# Patient Record
Sex: Male | Born: 1990 | Hispanic: No | Marital: Single | Smoking: Current every day smoker
Health system: Southern US, Community
[De-identification: ages and names within clinical notes are randomized; demographics above are authoritative.]

## PROBLEM LIST (undated history)

## (undated) DIAGNOSIS — I219 Acute myocardial infarction, unspecified: Secondary | ICD-10-CM

## (undated) DIAGNOSIS — D126 Benign neoplasm of colon, unspecified: Secondary | ICD-10-CM

## (undated) DIAGNOSIS — K589 Irritable bowel syndrome without diarrhea: Secondary | ICD-10-CM

## (undated) DIAGNOSIS — S0292XA Unspecified fracture of facial bones, initial encounter for closed fracture: Secondary | ICD-10-CM

## (undated) DIAGNOSIS — F419 Anxiety disorder, unspecified: Secondary | ICD-10-CM

## (undated) DIAGNOSIS — F99 Mental disorder, not otherwise specified: Secondary | ICD-10-CM

## (undated) HISTORY — DX: Benign neoplasm of colon, unspecified: D12.6

## (undated) HISTORY — DX: Irritable bowel syndrome without diarrhea: K58.9

## (undated) HISTORY — DX: Acute myocardial infarction, unspecified: I21.9

## (undated) HISTORY — DX: Unspecified fracture of facial bones, initial encounter for closed fracture: S02.92XA

---
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
## 2012-04-23 ENCOUNTER — Encounter (HOSPITAL_COMMUNITY): Payer: Self-pay | Admitting: *Deleted

## 2012-04-23 ENCOUNTER — Ambulatory Visit (HOSPITAL_COMMUNITY)
Admission: RE | Admit: 2012-04-23 | Discharge: 2012-04-23 | Disposition: A | Discharge status: Home / Self Care | Payer: 59 | Attending: Psychiatry | Admitting: Psychiatry

## 2012-04-23 HISTORY — DX: Anxiety disorder, unspecified: F41.9

## 2012-04-23 HISTORY — DX: Mental disorder, not otherwise specified: F99
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
## 2012-11-06 ENCOUNTER — Ambulatory Visit (INDEPENDENT_AMBULATORY_CARE_PROVIDER_SITE_OTHER): Payer: Managed Care, Other (non HMO) | Admitting: Physician Assistant

## 2012-11-06 VITALS — BP 102/72 | HR 73 | Temp 99.1°F | Resp 16 | Ht 70.5 in | Wt 164.4 lb

## 2012-11-06 DIAGNOSIS — F17200 Nicotine dependence, unspecified, uncomplicated: Secondary | ICD-10-CM | POA: Insufficient documentation

## 2012-11-06 DIAGNOSIS — Z23 Encounter for immunization: Secondary | ICD-10-CM

## 2012-11-06 DIAGNOSIS — K589 Irritable bowel syndrome without diarrhea: Secondary | ICD-10-CM | POA: Insufficient documentation

## 2012-11-06 DIAGNOSIS — Z113 Encounter for screening for infections with a predominantly sexual mode of transmission: Secondary | ICD-10-CM

## 2012-11-06 DIAGNOSIS — K645 Perianal venous thrombosis: Secondary | ICD-10-CM

## 2012-11-06 NOTE — Patient Instructions (Addendum)
Hemorrhoids Hemorrhoids are swollen veins around the rectum or anus. There are two types of hemorrhoids:   Internal hemorrhoids. These occur in the veins just inside the rectum. They may poke through to the outside and become irritated and painful.  External hemorrhoids. These occur in the veins outside the anus and can be felt as a painful swelling or hard lump near the anus. CAUSES  Pregnancy.   Obesity.   Constipation or diarrhea.   Straining to have a bowel movement.   Sitting for long periods on the toilet.  Heavy lifting or other activity that caused you to strain.  Anal intercourse. SYMPTOMS   Pain.   Anal itching or irritation.   Rectal bleeding.   Fecal leakage.   Anal swelling.   One or more lumps around the anus.  DIAGNOSIS  Your caregiver may be able to diagnose hemorrhoids by visual examination. Other examinations or tests that may be performed include:   Examination of the rectal area with a gloved hand (digital rectal exam).   Examination of anal canal using a small tube (scope).   A blood test if you have lost a significant amount of blood.  A test to look inside the colon (sigmoidoscopy or colonoscopy). TREATMENT Most hemorrhoids can be treated at home. However, if symptoms do not seem to be getting better or if you have a lot of rectal bleeding, your caregiver may perform a procedure to help make the hemorrhoids get smaller or remove them completely. Possible treatments include:   Placing a rubber band at the base of the hemorrhoid to cut off the circulation (rubber band ligation).   Injecting a chemical to shrink the hemorrhoid (sclerotherapy).   Using a tool to burn the hemorrhoid (infrared light therapy).   Surgically removing the hemorrhoid (hemorrhoidectomy).   Stapling the hemorrhoid to block blood flow to the tissue (hemorrhoid stapling).  HOME CARE INSTRUCTIONS   Eat foods with fiber, such as whole grains, beans,  nuts, fruits, and vegetables. Ask your doctor about taking products with added fiber in them (fibersupplements).  Increase fluid intake. Drink enough water and fluids to keep your urine clear or pale yellow.   Exercise regularly.   Go to the bathroom when you have the urge to have a bowel movement. Do not wait.   Avoid straining to have bowel movements.   Keep the anal area dry and clean. Use wet toilet paper or moist towelettes after a bowel movement.   Medicated creams and suppositories may be used or applied as directed.   Only take over-the-counter or prescription medicines as directed by your caregiver.   Take warm sitz baths for 15 20 minutes, 3 4 times a day to ease pain and discomfort.   Place ice packs on the hemorrhoids if they are tender and swollen. Using ice packs between sitz baths may be helpful.   Put ice in a plastic bag.   Place a towel between your skin and the bag.   Leave the ice on for 15 20 minutes, 3 4 times a day.   Do not use a donut-shaped pillow or sit on the toilet for long periods. This increases blood pooling and pain.  SEEK MEDICAL CARE IF:  You have increasing pain and swelling that is not controlled by treatment or medicine.  You have uncontrolled bleeding.  You have difficulty or you are unable to have a bowel movement.  You have pain or inflammation outside the area of the hemorrhoids. MAKE SURE YOU:  Understand these instructions.  Will watch your condition.  Will get help right away if you are not doing well or get worse. Document Released: 01/26/2000 Document Revised: 01/15/2012 Document Reviewed: 12/03/2011 [facility] Patient Information 2014 [place], [place].  HOME CARE INSTRUCTIONS after incision and drainage  Avoid straining when having bowel movements.  Avoid heavy lifting (more than 10 pounds (4.5 kilograms)).  Only take over-the-counter or prescription medicines for pain, discomfort, or fever as directed by  your caregiver.  Take hot sitz baths for 20 to 30 minutes, 3 to 4 times per day.  To keep swelling down, apply an ice pack for twenty minutes three to four times per day between sitz baths. Use a towel between your skin and the ice pack. Do not do this if it causes too much discomfort.  Keep anal area clean and dry. Following a bowel movement, you can gently wash the area with tucks (available for purchase at a drugstore) or cotton swabs. Gently pat the area dry. Do not rub the area.  Eat a well balanced diet and drink 6 to 8 glasses of water every day to avoid constipation. A bulk laxative may be also be helpful. SEEK MEDICAL CARE IF:   You have increasing pain or tenderness near or in the surgical site.  You are unable to eat or drink.  You develop nausea or vomiting.  You develop uncontrolled bleeding such as soaking two to three pads in one hour.  You have constipation, not helped by changing your diet or increasing your fluid intake. Pain medications are a common cause of constipation.  You have pain and redness (inflammation) extending outside the area of your surgery.  You develop an unexplained oral temperature above 102 F (38.9 C), or any other signs of infection.  You have any other questions or concerns following surgery. Document Released: 04/20/2003 Document Revised: 04/22/2011 Document Reviewed: 07/18/2008 [facility] Patient Information 2014 [place], [place].
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
## 2012-11-07 LAB — HEPATITIS B SURFACE ANTIGEN: Hepatitis B Surface Ag: NEGATIVE

## 2012-11-07 LAB — RPR

## 2012-11-07 LAB — HIV ANTIBODY (ROUTINE TESTING W REFLEX): HIV: NONREACTIVE

## 2012-11-07 LAB — HEPATITIS B SURFACE ANTIBODY, QUANTITATIVE: Hepatitis B-Post: 116 m[IU]/mL

## 2012-11-09 ENCOUNTER — Encounter: Payer: Self-pay | Admitting: Physician Assistant

## 2012-11-09 ENCOUNTER — Telehealth: Payer: Self-pay

## 2012-11-09 NOTE — Telephone Encounter (Signed)
Called patient concerning labs, patient states understanding.  He is having pain with bowel movements and would like to know how long that will last?  Please advise

## 2012-11-09 NOTE — Telephone Encounter (Signed)
Called patient he is still bleeding and swollen, can feel a knot still. Please advise. He states he has had temp , but not above 100

## 2012-11-09 NOTE — Telephone Encounter (Signed)
Please advise him to RTC for re-evaluation. We may be able to give him a topical anesthetic gel to use to reduce the pain, but need to check him again first.

## 2012-11-09 NOTE — Telephone Encounter (Signed)
As we discussed when he was here, it varies from patient to patient.  Does he have pain other than with BM? Any drainage? Any fever/chills?

## 2012-11-09 NOTE — Telephone Encounter (Signed)
Thank you I have called him to advise. He indicates he will come in tomorrow.

## 2012-11-11 ENCOUNTER — Ambulatory Visit (INDEPENDENT_AMBULATORY_CARE_PROVIDER_SITE_OTHER): Payer: Managed Care, Other (non HMO) | Admitting: Family Medicine

## 2012-11-11 ENCOUNTER — Telehealth: Payer: Self-pay

## 2012-11-11 VITALS — BP 110/66 | HR 88 | Temp 100.1°F | Resp 20 | Ht 69.5 in | Wt 164.8 lb

## 2012-11-11 DIAGNOSIS — K644 Residual hemorrhoidal skin tags: Secondary | ICD-10-CM

## 2012-11-11 DIAGNOSIS — K6289 Other specified diseases of anus and rectum: Secondary | ICD-10-CM

## 2012-11-11 DIAGNOSIS — K589 Irritable bowel syndrome without diarrhea: Secondary | ICD-10-CM

## 2012-11-11 DIAGNOSIS — R197 Diarrhea, unspecified: Secondary | ICD-10-CM

## 2012-11-11 LAB — POCT CBC
HCT, POC: 46.7 % (ref 43.5–53.7)
Hemoglobin: 14.3 g/dL (ref 14.1–18.1)
MCH, POC: 31.1 pg (ref 27–31.2)
MCV: 101.5 fL — AB (ref 80–97)
MID (cbc): 0.4 (ref 0–0.9)
POC Granulocyte: 3.7 (ref 2–6.9)
Platelet Count, POC: 221 10*3/uL (ref 142–424)
RBC: 4.6 M/uL — AB (ref 4.69–6.13)
RDW, POC: 14.5 %
WBC: 6.4 10*3/uL (ref 4.6–10.2)

## 2012-11-11 MED ORDER — LIDOCAINE 5 % EX OINT: TOPICAL_OINTMENT | Freq: Three times a day (TID) | CUTANEOUS | Status: DC | PRN

## 2012-11-11 NOTE — Telephone Encounter (Signed)
PT WAS SEEN FOR HEMORRHOIDS AND TOLD TO COME BACK IF DISCOMFORT, HOWEVER HE IS STILL BLEEDING AND DIDN'T KNOW HOW LONG THAT WILL BE GOING ON, ALSO THINK HE MAY NEED AN ANTIBIOTIC AND WASN'T GIVEN ANYTHING. PLEASE CALL [phone]

## 2012-11-11 NOTE — Patient Instructions (Addendum)
You can apply the prescription anesthetic to sore area up to 3 times per day. Avoid straining/lifting over 10 pounds for next 1 week. Soaks as discussed below. If not improving in another 4-5 days, return for recheck.  Sooner if worse.  We will refer you to a gastroenterologist as discussed.  If fever over 100.5 - return for recheck to determine if current infection, but your blood count appears ok tonight. Return to the clinic or go to the nearest emergency room if any of your symptoms worsen or new symptoms occur.  Hemorrhoids Hemorrhoids are swollen veins around the rectum or anus. There are two types of hemorrhoids:   Internal hemorrhoids. These occur in the veins just inside the rectum. They may poke through to the outside and become irritated and painful.  External hemorrhoids. These occur in the veins outside the anus and can be felt as a painful swelling or hard lump near the anus. CAUSES  Pregnancy.   Obesity.   Constipation or diarrhea.   Straining to have a bowel movement.   Sitting for long periods on the toilet.  Heavy lifting or other activity that caused you to strain.  Anal intercourse. SYMPTOMS   Pain.   Anal itching or irritation.   Rectal bleeding.   Fecal leakage.   Anal swelling.   One or more lumps around the anus.  DIAGNOSIS  Your caregiver may be able to diagnose hemorrhoids by visual examination. Other examinations or tests that may be performed include:   Examination of the rectal area with a gloved hand (digital rectal exam).   Examination of anal canal using a small tube (scope).   A blood test if you have lost a significant amount of blood.  A test to look inside the colon (sigmoidoscopy or colonoscopy). TREATMENT Most hemorrhoids can be treated at home. However, if symptoms do not seem to be getting better or if you have a lot of rectal bleeding, your caregiver may perform a procedure to help make the hemorrhoids get smaller  or remove them completely. Possible treatments include:   Placing a rubber band at the base of the hemorrhoid to cut off the circulation (rubber band ligation).   Injecting a chemical to shrink the hemorrhoid (sclerotherapy).   Using a tool to burn the hemorrhoid (infrared light therapy).   Surgically removing the hemorrhoid (hemorrhoidectomy).   Stapling the hemorrhoid to block blood flow to the tissue (hemorrhoid stapling).  HOME CARE INSTRUCTIONS   Eat foods with fiber, such as whole grains, beans, nuts, fruits, and vegetables. Ask your doctor about taking products with added fiber in them (fibersupplements).  Increase fluid intake. Drink enough water and fluids to keep your urine clear or pale yellow.   Exercise regularly.   Go to the bathroom when you have the urge to have a bowel movement. Do not wait.   Avoid straining to have bowel movements.   Keep the anal area dry and clean. Use wet toilet paper or moist towelettes after a bowel movement.   Medicated creams and suppositories may be used or applied as directed.   Only take over-the-counter or prescription medicines as directed by your caregiver.   Take warm sitz baths for 15 20 minutes, 3 4 times a day to ease pain and discomfort.   Place ice packs on the hemorrhoids if they are tender and swollen. Using ice packs between sitz baths may be helpful.   Put ice in a plastic bag.   Place a towel between  your skin and the bag.   Leave the ice on for 15 20 minutes, 3 4 times a day.   Do not use a donut-shaped pillow or sit on the toilet for long periods. This increases blood pooling and pain.  SEEK MEDICAL CARE IF:  You have increasing pain and swelling that is not controlled by treatment or medicine.  You have uncontrolled bleeding.  You have difficulty or you are unable to have a bowel movement.  You have pain or inflammation outside the area of the hemorrhoids. MAKE SURE YOU:  Understand  these instructions.  Will watch your condition.  Will get help right away if you are not doing well or get worse. Document Released: 01/26/2000 Document Revised: 01/15/2012 Document Reviewed: 12/03/2011 [employer] Patient Information 2014 [place], [place].
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
## 2012-11-11 NOTE — Telephone Encounter (Signed)
As I advised him previously, we do not expect continued problems. He is advised again to come in. He asked about antibiotic, advised him he should not need one, but if he is concerned we need to see him to make sure. He states he will come in today.

## 2012-11-19 ENCOUNTER — Encounter: Payer: Self-pay | Admitting: Gastroenterology

## 2012-11-23 ENCOUNTER — Encounter: Payer: Self-pay | Admitting: Gastroenterology

## 2012-11-23 ENCOUNTER — Ambulatory Visit (INDEPENDENT_AMBULATORY_CARE_PROVIDER_SITE_OTHER): Payer: Managed Care, Other (non HMO) | Admitting: Gastroenterology

## 2012-11-23 ENCOUNTER — Other Ambulatory Visit (INDEPENDENT_AMBULATORY_CARE_PROVIDER_SITE_OTHER): Payer: Managed Care, Other (non HMO)

## 2012-11-23 VITALS — BP 124/68 | HR 76 | Ht 69.5 in | Wt 163.0 lb

## 2012-11-23 DIAGNOSIS — K625 Hemorrhage of anus and rectum: Secondary | ICD-10-CM

## 2012-11-23 DIAGNOSIS — R195 Other fecal abnormalities: Secondary | ICD-10-CM

## 2012-11-23 DIAGNOSIS — K589 Irritable bowel syndrome without diarrhea: Secondary | ICD-10-CM

## 2012-11-23 DIAGNOSIS — K219 Gastro-esophageal reflux disease without esophagitis: Secondary | ICD-10-CM

## 2012-11-23 DIAGNOSIS — K648 Other hemorrhoids: Secondary | ICD-10-CM | POA: Insufficient documentation

## 2012-11-23 LAB — T4: T4, Total: 9 ug/dL (ref 5.0–12.5)

## 2012-11-23 MED ORDER — ESOMEPRAZOLE MAGNESIUM 40 MG PO CPDR: 40.0000 mg | DELAYED_RELEASE_CAPSULE | Freq: Every day | ORAL | Status: DC

## 2012-11-23 MED ORDER — HYDROCORTISONE ACETATE 25 MG RE SUPP: 25.0000 mg | Freq: Two times a day (BID) | RECTAL | Status: DC

## 2012-11-23 NOTE — Assessment & Plan Note (Signed)
Rectal bleeding is probably due to hemorrhoids.  Bleeding from underlying colitis should be ruled out.  Recommendations #1 colonoscopy

## 2012-11-23 NOTE — Assessment & Plan Note (Addendum)
The patient has had symptomatic hemorrhoids characterized by pain and thrombosis.  This is probably responsible for bleeding as well.  Recommendations #1 Anusol HC suppositories #2 to consider band ligation

## 2012-11-23 NOTE — Patient Instructions (Signed)
You have been scheduled for a colonoscopy with propofol. Please follow written instructions given to you at your visit today.  Please pick up your prep kit at the pharmacy within the next 1-3 days. If you use inhalers (even only as needed), please bring them with you on the day of your procedure. Your physician has requested that you go to www.startemmi.com and enter the access code given to you at your visit today. This web site gives a general overview about your procedure. However, you should still follow specific instructions given to you by our office regarding your preparation for the procedure.  Go to the basement for labs today Dexilant samples

## 2012-11-23 NOTE — Assessment & Plan Note (Addendum)
Frequent loose stools may be do to irritable bowel.  A structural abnormality should be ruled out.  Recommendations #1 colonoscopy
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
## 2012-11-23 NOTE — Assessment & Plan Note (Signed)
Patient is symptomatic with pyrosis despite Prilosec.  Recommendations #1 trial of Nexium 40 mg daily

## 2012-11-24 NOTE — Progress Notes (Signed)
Quick Note:  Please inform the patient that thyroid function tests were normal ______

## 2012-11-26 ENCOUNTER — Encounter: Payer: Self-pay | Admitting: Gastroenterology

## 2012-11-26 ENCOUNTER — Other Ambulatory Visit: Payer: Self-pay | Admitting: *Deleted

## 2012-11-26 ENCOUNTER — Ambulatory Visit (AMBULATORY_SURGERY_CENTER): Payer: Managed Care, Other (non HMO) | Admitting: Gastroenterology

## 2012-11-26 VITALS — BP 124/74 | HR 47 | Temp 97.5°F | Resp 21 | Ht 69.5 in | Wt 163.0 lb

## 2012-11-26 DIAGNOSIS — D126 Benign neoplasm of colon, unspecified: Secondary | ICD-10-CM

## 2012-11-26 DIAGNOSIS — K625 Hemorrhage of anus and rectum: Secondary | ICD-10-CM

## 2012-11-26 DIAGNOSIS — R195 Other fecal abnormalities: Secondary | ICD-10-CM

## 2012-11-26 DIAGNOSIS — K589 Irritable bowel syndrome without diarrhea: Secondary | ICD-10-CM

## 2012-11-26 MED ORDER — SODIUM CHLORIDE 0.9 % IV SOLN: 500.0000 mL | INTRAVENOUS | Status: DC

## 2012-11-26 NOTE — Patient Instructions (Addendum)

## 2012-11-26 NOTE — Progress Notes (Signed)
Called to room to assist during endoscopic procedure.  Patient ID and intended procedure confirmed with present staff. Received instructions for my participation in the procedure from the performing physician.  

## 2012-11-26 NOTE — Op Note (Signed)
[facility] [address] [place], [address]   COLONOSCOPY PROCEDURE REPORT  PATIENT: Phillip, Reese  MR#: [id number] BIRTHDATE: 1990-07-04 , 22  yrs. old GENDER: Male ENDOSCOPIST: Louis Meckel, MD REFERRED XB:[id number] Greene, M.D. PROCEDURE DATE:  11/26/2012 PROCEDURE:   Colonoscopy with snare polypectomy and Colonoscopy with biopsy First Screening Colonoscopy - Avg.  risk and is 50 yrs.  old or older Yes.  Prior Negative Screening - Now for repeat screening. N/A  History of Adenoma - Now for follow-up colonoscopy & has been > or = to 3 yrs.  N/A  Polyps Removed Today? Yes. ASA CLASS:   Class I INDICATIONS:Unexplained diarrhea. MEDICATIONS: MAC sedation, administered by CRNA and Propofol (Diprivan) 570 mg IV  DESCRIPTION OF PROCEDURE:   After the risks benefits and alternatives of the procedure were thoroughly explained, informed consent was obtained.  A digital rectal exam revealed no abnormalities of the rectum.   The LB [id number]  endoscope was introduced through the anus and advanced to the terminal ileum which was intubated for a short distance. No adverse events experienced.   The quality of the prep was excellent using Suprep The instrument was then slowly withdrawn as the colon was fully examined.      COLON FINDINGS: The mucosa appeared normal in the terminal ileum. A sessile polyp measuring 4 mm in size was found in the transverse colon.  A polypectomy was performed with a cold snare.  The resection was complete and the polyp tissue was completely retrieved.   A sessile polyp measuring 1 cm in size was found in the descending colon.  A polypectomy was performed with a cold snare.  The resection was complete and the polyp tissue was completely retrieved.   The colon mucosa was otherwise normal. Random biopsies were taken throughout the colon to rule out microscopic colitis Retroflexed views revealed no abnormalities. The time to  cecum=2 minutes 09 seconds.  Withdrawal time=24 minutes 09 seconds.  The scope was withdrawn and the procedure completed. COMPLICATIONS: There were no complications.  ENDOSCOPIC IMPRESSION: 1.   Normal mucosa in the terminal ileum 2.   Sessile polyp measuring 4 mm in size was found in the transverse colon; polypectomy was performed with a cold snare 3.   Sessile polyp measuring 1 cm in size was found in the descending colon; polypectomy was performed with a cold snare 4.   The colon mucosa was otherwise normal  RECOMMENDATIONS: 1.  If the polyp(s) removed today are proven to be adenomatous (pre-cancerous) polyps, you will need a repeat colonoscopy in 5 years.  Otherwise you should continue to follow colorectal cancer screening guidelines for "routine risk" patients with colonoscopy in 10 years.  You will receive a letter within 1-2 weeks with the results of your biopsy as well as final recommendations.  Please call my office if you have not received a letter after 3 weeks. 2.  Call office for follow-up appointment in 2-3 weeks   eSigned:  Louis Meckel, MD 11/26/2012 11:43 AM   cc:   PATIENT NAME:  Phillip, Reese MR#: [id number]

## 2012-11-26 NOTE — Progress Notes (Signed)
Report to pacu rn, vss, bbs=clear 

## 2012-11-26 NOTE — Progress Notes (Signed)
Patient did not have preoperative order for IV antibiotic SSI prophylaxis. ([id number])   

## 2012-11-26 NOTE — Progress Notes (Signed)
Pt requested to be HIPPA when checked in in admitting therefore Dr Arlyce Dice went over findings  with pt and report & avs & dc instructions reviewd with pt and then sealed in envelope and given to pt to review at home per protocol, then care partner brought back to recovery to be with pt as pt requested

## 2012-11-27 ENCOUNTER — Telehealth: Payer: Self-pay | Admitting: *Deleted

## 2012-11-27 ENCOUNTER — Encounter: Payer: Self-pay | Admitting: Gastroenterology

## 2012-11-27 NOTE — Telephone Encounter (Signed)
  Follow up Call-  Call back number 11/26/2012  Post procedure Call Back phone  # [phone]  Permission to leave phone message Yes     Patient questions:  Do you have a fever, pain , or abdominal swelling? yes Pain Score  6 *  Have you tolerated food without any problems? yes  Have you been able to return to your normal activities? no  Do you have any questions about your discharge instructions: Diet   no Medications  no Follow up visit  yes  Do you have questions or concerns about your Care? yes  Actions: * If pain score is 4 or above: Physician/ provider Notified : Melvia Heaps, MD  Patient states that he feels "like $%^&."   He states lower abd pain #6.  States that he doesn't want to go to ED.  States that he doesn't want any pain medication.  States that he won't come in "just to get pain medication."   Patient states that "He's used to this."  Wouldn't elaborate regarding what "it" was.    Note forwarded to Dr. Arlyce Dice.

## 2012-11-27 NOTE — Telephone Encounter (Signed)
Unfortunately he has many psychiatric issues.  Without specific symptoms I don't have any specific recommendations.

## 2012-12-02 ENCOUNTER — Telehealth: Payer: Self-pay | Admitting: Gastroenterology

## 2012-12-02 NOTE — Telephone Encounter (Signed)
Spoke with pt and reviewed path report with him, questions answered.

## 2012-12-03 ENCOUNTER — Telehealth: Payer: Self-pay | Admitting: Gastroenterology

## 2012-12-03 ENCOUNTER — Encounter: Payer: Self-pay | Admitting: Gastroenterology

## 2012-12-03 NOTE — Telephone Encounter (Signed)
Spoke with patient and discussed what a tubular adeno polyp is. He is also concerned about the OV being a month away. Moved OV to next week.

## 2012-12-09 ENCOUNTER — Encounter: Payer: Self-pay | Admitting: Gastroenterology

## 2012-12-09 ENCOUNTER — Ambulatory Visit (INDEPENDENT_AMBULATORY_CARE_PROVIDER_SITE_OTHER): Payer: Managed Care, Other (non HMO) | Admitting: Gastroenterology

## 2012-12-09 VITALS — BP 100/70 | HR 60 | Ht 69.5 in | Wt 169.6 lb

## 2012-12-09 DIAGNOSIS — K589 Irritable bowel syndrome without diarrhea: Secondary | ICD-10-CM

## 2012-12-09 DIAGNOSIS — K219 Gastro-esophageal reflux disease without esophagitis: Secondary | ICD-10-CM

## 2012-12-09 DIAGNOSIS — K625 Hemorrhage of anus and rectum: Secondary | ICD-10-CM

## 2012-12-09 DIAGNOSIS — D126 Benign neoplasm of colon, unspecified: Secondary | ICD-10-CM

## 2012-12-09 DIAGNOSIS — K649 Unspecified hemorrhoids: Secondary | ICD-10-CM

## 2012-12-09 DIAGNOSIS — K648 Other hemorrhoids: Secondary | ICD-10-CM

## 2012-12-09 NOTE — Assessment & Plan Note (Signed)
He continues to be symptomatic despite dexilant.  Plan to increase to twice a day.

## 2012-12-09 NOTE — Assessment & Plan Note (Signed)
Diarrhea has been a problem for at least 15 years.  Symptoms are likely secondary to irritable bowel.  Recommendations #1 high-fiber diet #2 Imodium 2 tabs every morning

## 2012-12-09 NOTE — Progress Notes (Signed)
History of Present Illness:  Phillip Reese has returned for followup of hemorrhoids and diarrhea.  Colonoscopy was remarkable for an adenomatous polyp which was removed.  Internal hemorrhoids were seen.  He continues to have 3-4 poorly formed stools a day accompanied by urgency.  He's having worsening hemorrhoidal discomfort characterized by burning and itching.  He also has frequent pyrosis despite taking dexilant.    Review of Systems: Pertinent positive and negative review of systems were noted in the above HPI section. All other review of systems were otherwise negative.    Current Medications, Allergies, Past Medical History, Past Surgical History, Family History and Social History were reviewed in [employer] electronic medical record  Vital signs were reviewed in today's medical record. Physical Exam: General: Well developed , well nourished, no acute distress

## 2012-12-09 NOTE — Assessment & Plan Note (Signed)
Plan followup colonoscopy in 5 years.  I also advised the patient to have his siblings and parents check for polyps since it is unusual to have an adenomatous polyp as such an early age.

## 2012-12-09 NOTE — Patient Instructions (Signed)
You have been scheduled for an appointment with _________ at [facility]. Your appointment is on ______ at ________. Please arrive at ______ for registration. Make certain to bring a list of current medications, including any over the counter medications or vitamins. Also bring your co-pay if you have one as well as your insurance cards. [facility] is located at [address], [address]. Should you need to reschedule your appointment, please contact them at [phone].   Hemorrhoids Hemorrhoids are swollen veins around the rectum or anus. There are two types of hemorrhoids:   Internal hemorrhoids. These occur in the veins just inside the rectum. They may poke through to the outside and become irritated and painful.  External hemorrhoids. These occur in the veins outside the anus and can be felt as a painful swelling or hard lump near the anus. CAUSES  Pregnancy.   Obesity.   Constipation or diarrhea.   Straining to have a bowel movement.   Sitting for long periods on the toilet.  Heavy lifting or other activity that caused you to strain.  Anal intercourse. SYMPTOMS   Pain.   Anal itching or irritation.   Rectal bleeding.   Fecal leakage.   Anal swelling.   One or more lumps around the anus.  DIAGNOSIS  Your caregiver may be able to diagnose hemorrhoids by visual examination. Other examinations or tests that may be performed include:   Examination of the rectal area with a gloved hand (digital rectal exam).   Examination of anal canal using a small tube (scope).   A blood test if you have lost a significant amount of blood.  A test to look inside the colon (sigmoidoscopy or colonoscopy). TREATMENT Most hemorrhoids can be treated at home. However, if symptoms do not seem to be getting better or if you have a lot of rectal bleeding, your caregiver may perform a procedure to help make the hemorrhoids get smaller or remove  them completely. Possible treatments include:   Placing a rubber band at the base of the hemorrhoid to cut off the circulation (rubber band ligation).   Injecting a chemical to shrink the hemorrhoid (sclerotherapy).   Using a tool to burn the hemorrhoid (infrared light therapy).   Surgically removing the hemorrhoid (hemorrhoidectomy).   Stapling the hemorrhoid to block blood flow to the tissue (hemorrhoid stapling).  HOME CARE INSTRUCTIONS   Eat foods with fiber, such as whole grains, beans, nuts, fruits, and vegetables. Ask your doctor about taking products with added fiber in them (fibersupplements).  Increase fluid intake. Drink enough water and fluids to keep your urine clear or pale yellow.   Exercise regularly.   Go to the bathroom when you have the urge to have a bowel movement. Do not wait.   Avoid straining to have bowel movements.   Keep the anal area dry and clean. Use wet toilet paper or moist towelettes after a bowel movement.   Medicated creams and suppositories may be used or applied as directed.   Only take over-the-counter or prescription medicines as directed by your caregiver.   Take warm sitz baths for 15 20 minutes, 3 4 times a day to ease pain and discomfort.   Place ice packs on the hemorrhoids if they are tender and swollen. Using ice packs between sitz baths may be helpful.   Put ice in a plastic bag.   Place a towel between your skin and the bag.   Leave the ice on for 15  20 minutes, 3 4 times a day.   Do not use a donut-shaped pillow or sit on the toilet for long periods. This increases blood pooling and pain.  SEEK MEDICAL CARE IF:  You have increasing pain and swelling that is not controlled by treatment or medicine.  You have uncontrolled bleeding.  You have difficulty or you are unable to have a bowel movement.  You have pain or inflammation outside the area of the hemorrhoids. MAKE SURE YOU:  Understand these  instructions.  Will watch your condition.  Will get help right away if you are not doing well or get worse. Document Released: 01/26/2000 Document Revised: 01/15/2012 Document Reviewed: 12/03/2011 [facility] Patient Information 2014 [place], [place].   Gastroesophageal Reflux Disease, Adult Gastroesophageal reflux disease (GERD) happens when acid from your stomach flows up into the esophagus. When acid comes in contact with the esophagus, the acid causes soreness (inflammation) in the esophagus. Over time, GERD may create small holes (ulcers) in the lining of the esophagus. CAUSES   Increased body weight. This puts pressure on the stomach, making acid rise from the stomach into the esophagus.  Smoking. This increases acid production in the stomach.  Drinking alcohol. This causes decreased pressure in the lower esophageal sphincter (valve or ring of muscle between the esophagus and stomach), allowing acid from the stomach into the esophagus.  Late evening meals and a full stomach. This increases pressure and acid production in the stomach.  A malformed lower esophageal sphincter. Sometimes, no cause is found. SYMPTOMS   Burning pain in the lower part of the mid-chest behind the breastbone and in the mid-stomach area. This may occur twice a week or more often.  Trouble swallowing.  Sore throat.  Dry cough.  Asthma-like symptoms including chest tightness, shortness of breath, or wheezing. DIAGNOSIS  Your caregiver may be able to diagnose GERD based on your symptoms. In some cases, X-rays and other tests may be done to check for complications or to check the condition of your stomach and esophagus. TREATMENT  Your caregiver may recommend over-the-counter or prescription medicines to help decrease acid production. Ask your caregiver before starting or adding any new medicines.  HOME CARE INSTRUCTIONS   Change the factors that you can control. Ask your caregiver for guidance concerning  weight loss, quitting smoking, and alcohol consumption.  Avoid foods and drinks that make your symptoms worse, such as:  Caffeine or alcoholic drinks.  Chocolate.  Peppermint or mint flavorings.  Garlic and onions.  Spicy foods.  Citrus fruits, such as oranges, lemons, or limes.  Tomato-based foods such as sauce, chili, salsa, and pizza.  Fried and fatty foods.  Avoid lying down for the 3 hours prior to your bedtime or prior to taking a nap.  Eat small, frequent meals instead of large meals.  Wear loose-fitting clothing. Do not wear anything tight around your waist that causes pressure on your stomach.  Raise the head of your bed 6 to 8 inches with wood blocks to help you sleep. Extra pillows will not help.  Only take over-the-counter or prescription medicines for pain, discomfort, or fever as directed by your caregiver.  Do not take aspirin, ibuprofen, or other nonsteroidal anti-inflammatory drugs (NSAIDs). SEEK IMMEDIATE MEDICAL CARE IF:   You have pain in your arms, neck, jaw, teeth, or back.  Your pain increases or changes in intensity or duration.  You develop nausea, vomiting, or sweating (diaphoresis).  You develop shortness of breath, or you faint.  Your vomit  is green, yellow, black, or looks like coffee grounds or blood.  Your stool is red, bloody, or black. These symptoms could be signs of other problems, such as heart disease, gastric bleeding, or esophageal bleeding. MAKE SURE YOU:   Understand these instructions.  Will watch your condition.  Will get help right away if you are not doing well or get worse. Document Released: 11/07/2004 Document Revised: 04/22/2011 Document Reviewed: 08/17/2010 [facility] Patient Information 2014 [place], [place].   Take two Imodium every day Take Dexilant twice a day 30 minutes before breakfast and dinner  High-Fiber Diet Fiber is found in fruits, vegetables, and grains. A high-fiber diet encourages the addition  of more whole grains, legumes, fruits, and vegetables in your diet. The recommended amount of fiber for adult males is 38 g per day. For adult females, it is 25 g per day. Pregnant and lactating women should get 28 g of fiber per day. If you have a digestive or bowel problem, ask your caregiver for advice before adding high-fiber foods to your diet. Eat a variety of high-fiber foods instead of only a select few type of foods.  PURPOSE  To increase stool bulk.  To make bowel movements more regular to prevent constipation.  To lower cholesterol.  To prevent overeating. WHEN IS THIS DIET USED?  It may be used if you have constipation and hemorrhoids.  It may be used if you have uncomplicated diverticulosis (intestine condition) and irritable bowel syndrome.  It may be used if you need help with weight management.  It may be used if you want to add it to your diet as a protective measure against atherosclerosis, diabetes, and cancer. SOURCES OF FIBER  Whole-grain breads and cereals.  Fruits, such as apples, oranges, bananas, berries, prunes, and pears.  Vegetables, such as green peas, carrots, sweet potatoes, beets, broccoli, cabbage, spinach, and artichokes.  Legumes, such split peas, soy, lentils.  Almonds. FIBER CONTENT IN FOODS Starches and Grains / Dietary Fiber (g)  Cheerios, 1 cup / 3 g  Corn Flakes cereal, 1 cup / 0.7 g  Rice crispy treat cereal, 1 cup / 0.3 g  Instant oatmeal (cooked),  cup / 2 g  Frosted wheat cereal, 1 cup / 5.1 g  Brown, long-grain rice (cooked), 1 cup / 3.5 g  White, long-grain rice (cooked), 1 cup / 0.6 g  Enriched macaroni (cooked), 1 cup / 2.5 g Legumes / Dietary Fiber (g)  Baked beans (canned, plain, or vegetarian),  cup / 5.2 g  Kidney beans (canned),  cup / 6.8 g  Pinto beans (cooked),  cup / 5.5 g Breads and Crackers / Dietary Fiber (g)  Plain or honey graham crackers, 2 squares / 0.7 g  Saltine crackers, 3 squares / 0.3  g  Plain, salted pretzels, 10 pieces / 1.8 g  Whole-wheat bread, 1 slice / 1.9 g  White bread, 1 slice / 0.7 g  Raisin bread, 1 slice / 1.2 g  Plain bagel, 3 oz / 2 g  Flour tortilla, 1 oz / 0.9 g  Corn tortilla, 1 small / 1.5 g  Hamburger or hotdog bun, 1 small / 0.9 g Fruits / Dietary Fiber (g)  Apple with skin, 1 medium / 4.4 g  Sweetened applesauce,  cup / 1.5 g  Banana,  medium / 1.5 g  Grapes, 10 grapes / 0.4 g  Orange, 1 small / 2.3 g  Raisin, 1.5 oz / 1.6 g  Melon, 1 cup / 1.4 g  Vegetables / Dietary Fiber (g)  Green beans (canned),  cup / 1.3 g  Carrots (cooked),  cup / 2.3 g  Broccoli (cooked),  cup / 2.8 g  Peas (cooked),  cup / 4.4 g  Mashed potatoes,  cup / 1.6 g  Lettuce, 1 cup / 0.5 g  Corn (canned),  cup / 1.6 g  Tomato,  cup / 1.1 g Document Released: 01/28/2005 Document Revised: 07/30/2011 Document Reviewed: 05/02/2011 [facility] Patient Information 2014 [place], [place].

## 2012-12-09 NOTE — Assessment & Plan Note (Addendum)
Secondary to hemorrhoids

## 2012-12-09 NOTE — Assessment & Plan Note (Signed)
He continues to have hemorrhoidal discomfort despite suppositories.  Plan referral to Dr. Romie Levee for further evaluation and therapy.

## 2012-12-17 ENCOUNTER — Telehealth: Payer: Self-pay | Admitting: Gastroenterology

## 2012-12-17 NOTE — Telephone Encounter (Signed)
Spoke with pt and gave him the appt date and time that is in epic with [facility]. Called [facility] and was told they left a message for the pt on 12/14/12. Pt given the phone number for [facility] to call with any questions.

## 2012-12-21 ENCOUNTER — Encounter (INDEPENDENT_AMBULATORY_CARE_PROVIDER_SITE_OTHER): Payer: Self-pay | Admitting: General Surgery

## 2012-12-21 ENCOUNTER — Ambulatory Visit (INDEPENDENT_AMBULATORY_CARE_PROVIDER_SITE_OTHER): Payer: Managed Care, Other (non HMO) | Admitting: General Surgery

## 2012-12-21 ENCOUNTER — Encounter (INDEPENDENT_AMBULATORY_CARE_PROVIDER_SITE_OTHER): Payer: Self-pay

## 2012-12-21 ENCOUNTER — Telehealth: Payer: Self-pay | Admitting: Gastroenterology

## 2012-12-21 VITALS — BP 106/72 | HR 80 | Temp 98.7°F | Resp 15 | Ht 72.0 in | Wt 172.8 lb

## 2012-12-21 DIAGNOSIS — K625 Hemorrhage of anus and rectum: Secondary | ICD-10-CM

## 2012-12-21 NOTE — Patient Instructions (Signed)
GETTING TO GOOD BOWEL HEALTH. Irregular bowel habits can lead to many problems over time.  Having one soft, formed bowel movement a day is the most important way to prevent further problems.  The anorectal canal is designed to handle stretching and feces to safely manage our ability to get rid of solid waste (feces, poop, stool) out of our body.  BUT, loose stools can be a burning fire to this very sensitive area of our body, causing inflamed hemorrhoids, anal fissures, increasing risk is perirectal abscesses, abdominal pain and bloating.     The goal: ONE SOFT BOWEL MOVEMENT A DAY!  To have soft, regular bowel movements:    Drink at least 8 tall glasses of water a day.     Take plenty of fiber.  Fiber is the undigested part of plant food that passes into the colon, acting s "natures broom" to encourage bowel motility and movement.  Fiber can absorb and hold large amounts of water. This results in a larger, bulkier stool, which is soft and easier to pass. Work gradually over several weeks up to 6 servings a day of fiber (25g a day even more if needed) in the form of: o Vegetables -- Root (potatoes, carrots, turnips), leafy green (lettuce, salad greens, celery, spinach), or cooked high residue (cabbage, broccoli, etc) o Fruit -- Fresh (unpeeled skin & pulp), Dried (prunes, apricots, cherries, etc ),  or stewed ( applesauce)  o Whole grain breads, pasta, etc (whole wheat)  o Bran cereals    Bulking Agents -- This type of water-retaining fiber generally is easily obtained each day by one of the following:  o Psyllium bran -- The psyllium plant is remarkable because its ground seeds can retain so much water. This product is available as Metamucil, Konsyl, Effersyllium, Per Diem Fiber, or the less expensive generic preparation in drug and health food stores. Although labeled a laxative, it really is not a laxative.  o Methylcellulose -- This is another fiber derived from wood which also retains water. It is  available as Citrucel. o Polyethylene Glycol - and "artificial" fiber commonly called Miralax or Glycolax.  It is helpful for people with gassy or bloated feelings with regular fiber o Flax Seed - a less gassy fiber than psyllium   Controlling diarrhea o Switch to liquids and simpler foods for a few days to avoid stressing your intestines further. o Avoid dairy products (especially milk & ice cream) for a short time.  The intestines often can lose the ability to digest lactose when stressed. o Avoid foods that cause gassiness or bloating.  Typical foods include beans and other legumes, cabbage, broccoli, and dairy foods.  Every person has some sensitivity to other foods, so listen to our body and avoid those foods that trigger problems for you. o Adding fiber (Citrucel, Metamucil, psyllium, Miralax) gradually can help thicken stools by absorbing excess fluid and retrain the intestines to act more normally.  Slowly increase the dose over a few weeks.  Too much fiber too soon can backfire and cause cramping & bloating. o Probiotics (such as active yogurt, Align, etc) may help repopulate the intestines and colon with normal bacteria and calm down a sensitive digestive tract.  Most studies show it to be of mild help, though, and such products can be costly. o Medicines:   Bismuth subsalicylate (ex. Kayopectate, Pepto Bismol) every 30 minutes for up to 6 doses can help control diarrhea.  Avoid if pregnant.   Loperamide (Immodium) can slow  down diarrhea.  Start with two tablets (4mg  total) first and then try one tablet every 6 hours.  Avoid if you are having fevers or severe pain.  If you are not better or start feeling worse, stop all medicines and call your doctor for advice o Call your doctor if you are getting worse or not better.  Sometimes further testing (cultures, endoscopy, X-ray studies, bloodwork, etc) may be needed to help diagnose and treat the cause of the diarrhea.

## 2012-12-21 NOTE — Telephone Encounter (Signed)
Dr. Arlyce Dice pt was seen by [facility] and told to follow-up with our office regarding rectal bleeding and diet. Please see note from [facility] and please advise.
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
## 2012-12-22 NOTE — Telephone Encounter (Signed)
Pt scheduled to see Dr. Arlyce Dice 01/20/13@9 :30am. Pt aware of appt.

## 2012-12-22 NOTE — Telephone Encounter (Signed)
Have him return for followup visit in 2-3 weeks

## 2013-01-06 ENCOUNTER — Ambulatory Visit: Payer: Self-pay | Admitting: Gastroenterology

## 2013-01-20 ENCOUNTER — Encounter: Payer: Self-pay | Admitting: Gastroenterology

## 2013-01-20 ENCOUNTER — Ambulatory Visit (INDEPENDENT_AMBULATORY_CARE_PROVIDER_SITE_OTHER): Payer: Managed Care, Other (non HMO) | Admitting: Gastroenterology

## 2013-01-20 VITALS — BP 110/80 | HR 81 | Ht 70.0 in | Wt 170.4 lb

## 2013-01-20 DIAGNOSIS — K589 Irritable bowel syndrome without diarrhea: Secondary | ICD-10-CM

## 2013-01-20 DIAGNOSIS — K648 Other hemorrhoids: Secondary | ICD-10-CM

## 2013-01-20 NOTE — Progress Notes (Signed)
History of Present Illness:  The patient has returned for followup of diarrhea and hemorrhoids.  He typically has 3-4 loose stools a day.  He takes one Imodium daily.  He was evaluated by colorectal surgery who felt that his rectal discomfort is primarily related to diarrhea.    Review of Systems: Pertinent positive and negative review of systems were noted in the above HPI section. All other review of systems were otherwise negative.    Current Medications, Allergies, Past Medical History, Past Surgical History, Family History and Social History were reviewed in [employer] electronic medical record  Vital signs were reviewed in today's medical record. Physical Exam: General: Well developed , well nourished, no acute distress

## 2013-01-20 NOTE — Assessment & Plan Note (Signed)
No further therapy beyond suppositories while aiming control at his diarrheal symptoms

## 2013-01-20 NOTE — Assessment & Plan Note (Signed)
Diarrhea is secondary to IBS.  Patient was instructed to use fiber supplements daily and to take 2 Imodium every morning and then every 6 hours thereafter as needed provided that he does not develop abdominal distention.

## 2013-01-20 NOTE — Patient Instructions (Signed)
Use Fiber Supplements daily Use 2 Imodium every morning the every 6 hours as needed Continue if no bloating Follow up as needed

## 2013-04-21 ENCOUNTER — Telehealth: Payer: Self-pay | Admitting: Gastroenterology

## 2013-04-21 NOTE — Telephone Encounter (Signed)
Pt states he has continued to have blood in his stool since his colon in October. Pt states his BM's have been irregular also and he has pain with the bowel movements. Pt scheduled to see Nicoletta Ba PA 04/23/13@9am . Pt aware of appt.

## 2013-04-23 ENCOUNTER — Other Ambulatory Visit (INDEPENDENT_AMBULATORY_CARE_PROVIDER_SITE_OTHER): Payer: Managed Care, Other (non HMO)

## 2013-04-23 ENCOUNTER — Encounter: Payer: Self-pay | Admitting: Physician Assistant

## 2013-04-23 ENCOUNTER — Ambulatory Visit (INDEPENDENT_AMBULATORY_CARE_PROVIDER_SITE_OTHER): Payer: Managed Care, Other (non HMO) | Admitting: Physician Assistant

## 2013-04-23 VITALS — BP 136/62 | HR 98 | Ht 70.0 in | Wt 174.0 lb

## 2013-04-23 DIAGNOSIS — R197 Diarrhea, unspecified: Secondary | ICD-10-CM

## 2013-04-23 DIAGNOSIS — K625 Hemorrhage of anus and rectum: Secondary | ICD-10-CM

## 2013-04-23 DIAGNOSIS — F489 Nonpsychotic mental disorder, unspecified: Secondary | ICD-10-CM

## 2013-04-23 DIAGNOSIS — K219 Gastro-esophageal reflux disease without esophagitis: Secondary | ICD-10-CM

## 2013-04-23 DIAGNOSIS — F99 Mental disorder, not otherwise specified: Secondary | ICD-10-CM | POA: Insufficient documentation

## 2013-04-23 LAB — CBC WITH DIFFERENTIAL/PLATELET
Basophils Absolute: 0 K/uL (ref 0.0–0.1)
Basophils Relative: 0.3 % (ref 0.0–3.0)
Eosinophils Absolute: 0 K/uL (ref 0.0–0.7)
Eosinophils Relative: 0.3 % (ref 0.0–5.0)
HCT: 45.5 % (ref 39.0–52.0)
Hemoglobin: 15.1 g/dL (ref 13.0–17.0)
Lymphocytes Relative: 17.4 % (ref 12.0–46.0)
Lymphs Abs: 1.4 K/uL (ref 0.7–4.0)
MCHC: 33.1 g/dL (ref 30.0–36.0)
MCV: 95.1 fl (ref 78.0–100.0)
Monocytes Absolute: 0.5 K/uL (ref 0.1–1.0)
Monocytes Relative: 6 % (ref 3.0–12.0)
Neutro Abs: 6.3 K/uL (ref 1.4–7.7)
Neutrophils Relative %: 76 % (ref 43.0–77.0)
Platelets: 249 K/uL (ref 150.0–400.0)
RBC: 4.79 Mil/uL (ref 4.22–5.81)
RDW: 14 % (ref 11.5–14.6)
WBC: 8.3 K/uL (ref 4.5–10.5)

## 2013-04-23 LAB — HIGH SENSITIVITY CRP: CRP HIGH SENSITIVITY: 2.49 mg/L (ref 0.000–5.000)

## 2013-04-23 MED ORDER — OMEPRAZOLE-SODIUM BICARBONATE 40-1100 MG PO CAPS: ORAL_CAPSULE | ORAL | Status: DC

## 2013-04-23 NOTE — Progress Notes (Signed)
Subjective:    Patient ID: Phillip Bachelor., male    DOB: [birth year], 23 y.o.   MRN: [id number]  HPI Phillip Reese  is a 23 year old white male known to Dr. Deatra Ina who has history of chronic diarrhea and intermittent complaints of rectal bleeding. His diarrhea is felt secondary to IBS. He did undergo a colonoscopy in October of 2014 this showed 2 sessile polyps which were removed these were tubular adenomas, one was a benign hamartomatous polyp , also had small internal hemorrhoids. Random biopsies were taken and these were negative. Patient has been on one Imodium per day and was asked to take a fiber supplement.  Patient comes back today stating that he is having horrible problems with acid reflux. He apparently had been given samples of Dexilant 60 mg by mouth daily and says he's been taking these and does not feel the medicine is doing any good. He has no complaints of dysphagia or odynophagia. He says sometimes he feels like he is "on fire". He has started several new psychiatric medicines within the past couple of months including amitriptyline lithium and Seroquel.  He is also very concerned about small amounts of dark blood that he feels is present intermittently in his stool. Says this seems to be no more noticeable straining. He denies any problems with constipation or abdominal pain. He says he is having bowel movements daily and despite taking Imodium as loose to mushy stools. He says he wants to know what wrong with his intestinal tract why he is growing polyps and why he has chronic diarrhea He has no family history of celiac disease or IBD though his mother does have Lupus. He denies any regular aspirin or NSAIDs    Review of Systems  Constitutional: Negative.   HENT: Negative.   Eyes: Negative.   Respiratory: Negative.   Gastrointestinal: Positive for diarrhea, blood in stool and rectal pain.  Endocrine: Negative.   Genitourinary: Negative.   Musculoskeletal: Negative.     Allergic/Immunologic: Negative.   Hematological: Negative.   Psychiatric/Behavioral: The patient is nervous/anxious.    Outpatient Prescriptions Prior to Visit  Medication Sig Dispense Refill  . Dexlansoprazole (DEXILANT) 30 MG capsule Take 30 mg by mouth daily.      Marland Kitchen dicyclomine (BENTYL) 10 MG capsule Take 10 mg by mouth 4 (four) times daily -  before meals and at bedtime.       No facility-administered medications prior to visit.      No Known Allergies Patient Active Problem List   Diagnosis Date Noted  . Psychiatric disorder 04/23/2013  . 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 Hemorrhage of rectum and anus 11/23/2012  . Internal hemorrhoids with other complication 66/44/0347  . Esophageal reflux 11/23/2012  . Tobacco use disorder 11/06/2012  . IBS (irritable bowel syndrome)    History  Substance Use Topics  . Smoking status: Current Every Day Smoker -- 1.50 packs/day    Types: Cigarettes  . Smokeless tobacco: Former Systems developer     Comment: dipped in high school  . Alcohol Use: No     Comment: once or twice a month   family history includes Diabetes in his paternal grandfather; Lupus in his mother; Stomach cancer in an other family member. There is no history of Colon cancer.  Objective:   Physical Exam  well-developed young white male in no acute distress very intense affect. Blood pressure 136/62 pulse is 98 height 5 foot 10 weight 174. HEENT; nontraumatic normocephalic EOMI PERRLA sclera  anicteric, Supple ;no JVD, Cardiovascular; regular rate and rhythm with S1-S2 no murmur or gallop, Pulmonary; clear bilaterally, Abdomen; soft nontender nondistended bowel sounds are active there is no palpable mass or hepatosplenomegaly, Rectal;exam no external hemorrhoids noted digital exam negative no enlarged hemorrhoids stool Hemoccult negative extremities no clubbing cyanosis or edema skin warm dry, Psych; mood and affect appropriate        Assessment & Plan:  #1 23 ALT white male  with chronic diarrhea for several years with current diagnosis of IBS. Will rule out celiac disease or IBD #2 refractory GERD #3 complaints of intermittent rectal bleeding stool her currently Hemoccult negative #4 underlying psychiatric disorder type unspecified   Plan; check CBC, celiac panel and CRP Schedule for upper endoscopy with Dr. Michelene Heady discussed in detail with the patient this should also be done with small bowel biopsies Schedule CT scan of the abdomen and pelvis to further evaluate and exclude IBD Switch to Zegerid 40 mg at dinnertime as the majority of his reflux symptoms are in the evening and nocturnally Strict antireflux regimen   Reviewed and agree with management. Sandy Salaam. Deatra Ina, M.D., [facility]

## 2013-04-23 NOTE — Patient Instructions (Signed)
Please go to the basement level to have your labs drawn.   You have been scheduled for an endoscopy with propofol. Please follow written instructions given to you at your visit today. If you use inhalers (even only as needed), please bring them with you on the day of your procedure.   You have been scheduled for a CT scan of the abdomen and pelvis at [facility] ([address]---this is in the same building as Press [facility]).   You are scheduled on 3-17 Tues at 3:30 PM . You should arrive at 3: 15 PM  prior to your appointment time for registration. Please follow the written instructions below on the day of your exam:  WARNING: IF YOU ARE ALLERGIC TO IODINE/X-RAY DYE, PLEASE NOTIFY RADIOLOGY IMMEDIATELY AT [phone]! YOU WILL BE GIVEN A 13 HOUR PREMEDICATION PREP.  1) Do not eat or drink anything after 11:30 am  (4 hours prior to your test) 2) You have been given 2 bottles of oral contrast to drink. The solution may taste better if refrigerated, but do NOT add ice or any other liquid to this solution. Shake  well before drinking.    Drink 1 bottle of contrast @ 1:30 PM (2 hours prior to your exam)  Drink 1 bottle of contrast @ 2:30 PM  (1 hour prior to your exam)  You may take any medications as prescribed with a small amount of water except for the following: Metformin, Glucophage, Glucovance, Avandamet, Riomet, Fortamet, Actoplus Met, Janumet, Glumetza or Metaglip. The above medications must be held the day of the exam AND 48 hours after the exam.  The purpose of you drinking the oral contrast is to aid in the visualization of your intestinal tract. The contrast solution may cause some diarrhea. Before your exam is started, you will be given a small amount of fluid to drink. Depending on your individual set of symptoms, you may also receive an intravenous injection of x-ray contrast/dye. Plan on being at [facility] for 30 minutes or long, depending on the type of  exam you are having performed.  If you have any questions regarding your exam or if you need to reschedule, you may call the CT department at [phone] between the hours of 8:00 am and 5:00 pm, Monday-Friday.  ________________________________________________________________________

## 2013-04-26 ENCOUNTER — Ambulatory Visit (AMBULATORY_SURGERY_CENTER): Payer: Managed Care, Other (non HMO) | Admitting: Gastroenterology

## 2013-04-26 ENCOUNTER — Encounter: Payer: Self-pay | Admitting: Gastroenterology

## 2013-04-26 VITALS — BP 126/76 | HR 75 | Temp 99.6°F | Resp 19 | Ht 70.0 in | Wt 174.0 lb

## 2013-04-26 DIAGNOSIS — R197 Diarrhea, unspecified: Secondary | ICD-10-CM

## 2013-04-26 DIAGNOSIS — D1330 Benign neoplasm of unspecified part of small intestine: Secondary | ICD-10-CM

## 2013-04-26 DIAGNOSIS — K219 Gastro-esophageal reflux disease without esophagitis: Secondary | ICD-10-CM

## 2013-04-26 LAB — CELIAC PANEL 10
ENDOMYSIAL SCREEN: NEGATIVE
GLIADIN IGG: 10 U/mL (ref <20)
Gliadin IgA: 3.3 U/mL (ref <20)
IGA: 134 mg/dL (ref 68–379)
Tissue Transglut Ab: 3.3 U/mL (ref <20)
Tissue Transglutaminase Ab, IgA: 2.3 U/mL (ref <20)

## 2013-04-26 MED ORDER — SODIUM CHLORIDE 0.9 % IV SOLN: 500.0000 mL | INTRAVENOUS | Status: DC

## 2013-04-26 NOTE — Progress Notes (Signed)
A/ox3 pleased with MAC, report to Jane RN 

## 2013-04-26 NOTE — Progress Notes (Signed)
Called to room to assist during endoscopic procedure.  Patient ID and intended procedure confirmed with present staff. Received instructions for my participation in the procedure from the performing physician.  

## 2013-04-26 NOTE — Op Note (Signed)
[facility]  [address] [place], [address]   ENDOSCOPY PROCEDURE REPORT  PATIENT: Phillip Reese, Phillip Reese  MR#: [id number] BIRTHDATE: 02/21/1990 , 22  yrs. old GENDER: Male ENDOSCOPIST: Inda Castle, MD REFERRED BY:  Merri Ray, M.D. PROCEDURE DATE:  04/26/2013 PROCEDURE:  EGD w/ biopsy ASA CLASS:     Class II INDICATIONS:  Heartburn.   Chronic diarrhea. MEDICATIONS: MAC sedation, administered by CRNA, Propofol (Diprivan) 260 mg IV, and Simethicone 0.6cc PO TOPICAL ANESTHETIC:  DESCRIPTION OF PROCEDURE: After the risks benefits and alternatives of the procedure were thoroughly explained, informed consent was obtained.  The LB [id number] endoscope was introduced through the mouth and advanced to the third portion of the duodenum. Without limitations.  The instrument was slowly withdrawn as the mucosa was fully examined.      The upper, middle and distal third of the esophagus were carefully inspected and no abnormalities were noted.  The z-line was well seen at the GEJ.  The endoscope was pushed into the fundus which was normal including a retroflexed view.  The antrum, gastric body, first and second part of the duodenum were unremarkable. Random biopsies were taking in the bulb, second and third portions of the duodenum to rule out celiac disease. Retroflexed views revealed no abnormalities.     The scope was then withdrawn from the patient and the procedure completed.  COMPLICATIONS: There were no complications. ENDOSCOPIC IMPRESSION: Normal EGD  RECOMMENDATIONS: Await pathology results continue Zegerid 40 mg at bedtime Office visit 2-3 weeks   eSigned:  Inda Castle, MD 04/26/2013 4:15 PM   CC:

## 2013-04-26 NOTE — Patient Instructions (Signed)
YOU HAD AN ENDOSCOPIC PROCEDURE TODAY AT [facility]: Refer to the procedure report that was given to you for any specific questions about what was found during the examination.  If the procedure report does not answer your questions, please call your gastroenterologist to clarify.  If you requested that your care partner not be given the details of your procedure findings, then the procedure report has been included in a sealed envelope for you to review at your convenience later.  YOU SHOULD EXPECT: Some feelings of bloating in the abdomen. Passage of more gas than usual.  Walking can help get rid of the air that was put into your GI tract during the procedure and reduce the bloating. If you had a lower endoscopy (such as a colonoscopy or flexible sigmoidoscopy) you may notice spotting of blood in your stool or on the toilet paper. If you underwent a bowel prep for your procedure, then you may not have a normal bowel movement for a few days.  DIET: Your first meal following the procedure should be a light meal and then it is ok to progress to your normal diet.  A half-sandwich or bowl of soup is an example of a good first meal.  Heavy or fried foods are harder to digest and may make you feel nauseous or bloated.  Likewise meals heavy in dairy and vegetables can cause extra gas to form and this can also increase the bloating.  Drink plenty of fluids but you should avoid alcoholic beverages for 24 hours.  ACTIVITY: Your care partner should take you home directly after the procedure.  You should plan to take it easy, moving slowly for the rest of the day.  You can resume normal activity the day after the procedure however you should NOT DRIVE or use heavy machinery for 24 hours (because of the sedation medicines used during the test).    SYMPTOMS TO REPORT IMMEDIATELY: A gastroenterologist can be reached at any hour.  During normal business hours, 8:30 AM to 5:00 PM Monday through Friday,  call [phone].  After hours and on weekends, please call the GI answering service at [phone] who will take a message and have the physician on call contact you.      Following upper endoscopy (EGD)  Vomiting of blood or coffee ground material  New chest pain or pain under the shoulder blades  Painful or persistently difficult swallowing  New shortness of breath  Fever of 100F or higher  Black, tarry-looking stools  FOLLOW UP: If any biopsies were taken you will be contacted by phone or by letter within the next 1-3 weeks.  Call your gastroenterologist if you have not heard about the biopsies in 3 weeks.  Our staff will call the home number listed on your records the next business day following your procedure to check on you and address any questions or concerns that you may have at that time regarding the information given to you following your procedure. This is a courtesy call and so if there is no answer at the home number and we have not heard from you through the emergency physician on call, we will assume that you have returned to your regular daily activities without incident.  SIGNATURES/CONFIDENTIALITY: You and/or your care partner have signed paperwork which will be entered into your electronic medical record.  These signatures attest to the fact that that the information above on your After Visit Summary has been reviewed and is understood.  Full responsibility of the confidentiality of this discharge information lies with you and/or your care-partner.  Normal EGD-   Biopsies done to rule out celiac disease.     Continue Zegerid 40mg  at bedtime.   See Dr. Deatra Ina in 2-3 weeks.

## 2013-04-27 ENCOUNTER — Telehealth: Payer: Self-pay

## 2013-04-27 ENCOUNTER — Ambulatory Visit (INDEPENDENT_AMBULATORY_CARE_PROVIDER_SITE_OTHER)
Admission: RE | Admit: 2013-04-27 | Discharge: 2013-04-27 | Disposition: A | Discharge status: Home / Self Care | Payer: Managed Care, Other (non HMO) | Source: Ambulatory Visit | Attending: Physician Assistant | Admitting: Physician Assistant

## 2013-04-27 DIAGNOSIS — R197 Diarrhea, unspecified: Secondary | ICD-10-CM

## 2013-04-27 DIAGNOSIS — K625 Hemorrhage of anus and rectum: Secondary | ICD-10-CM

## 2013-04-27 NOTE — Telephone Encounter (Signed)
  Follow up Call-  Call back number 04/26/2013 11/26/2012  Post procedure Call Back phone  # [phone]  Permission to leave phone message Yes Yes     Patient questions:  Do you have a fever, pain , or abdominal swelling? no Pain Score  0 *  Have you tolerated food without any problems? yes  Have you been able to return to your normal activities? yes  Do you have any questions about your discharge instructions: Diet   no Medications  no Follow up visit  no  Do you have questions or concerns about your Care? no  Actions: * If pain score is 4 or above: No action needed, pain <4.

## 2013-04-30 NOTE — Progress Notes (Signed)
Quick Note:  Please inform the patient that small bowel biopsies were normal and to continue current plan of action. Needs followup office visit ______ 

## 2013-06-01 ENCOUNTER — Ambulatory Visit: Payer: Self-pay | Admitting: Gastroenterology

## 2013-06-15 ENCOUNTER — Telehealth: Payer: Self-pay | Admitting: Gastroenterology

## 2013-06-15 ENCOUNTER — Other Ambulatory Visit: Payer: Self-pay

## 2013-06-15 DIAGNOSIS — K625 Hemorrhage of anus and rectum: Secondary | ICD-10-CM

## 2013-06-15 MED ORDER — OMEPRAZOLE-SODIUM BICARBONATE 40-1100 MG PO CAPS: 1.0000 | ORAL_CAPSULE | Freq: Every day | ORAL | Status: AC

## 2013-06-15 MED ORDER — HYDROCORTISONE ACETATE 25 MG RE SUPP: 25.0000 mg | Freq: Two times a day (BID) | RECTAL | Status: AC

## 2013-06-15 MED ORDER — LIDOCAINE 5 % EX OINT: TOPICAL_OINTMENT | CUTANEOUS | Status: AC

## 2013-06-15 NOTE — Telephone Encounter (Signed)
Check a CBC having any dizziness or weakness.  If the answer is no, he probably does not need to go to the ER.  I suspect this is hemorrhoidal bleeding.  He should take a hemorrhoidal suppository.  See if we can fit him in the office this week

## 2013-06-15 NOTE — Telephone Encounter (Signed)
Left message for pt to call back.  Pt states that he is having a lot of dark rectal bleeding and states he sees some clots. Pt states it is from his hemorrhoids. States he had external hemorrhoids "cut out" by urgent care a while back. Discussed with pt that he should go to the ER to be evaluated if he is passing clots. Pt does not think he needs to go to the ER, wanted to see what Dr. Deatra Ina recommends. Please advise.

## 2013-06-15 NOTE — Telephone Encounter (Signed)
Pt states he is not dizzy and is not weak. He knows to come for CBC. Pt requested refill on suppositories and xylocaine ointment and zegerid. Script sent in for refill on these. Pt scheduled to see Nicoletta Ba PA Thursday at 10am.

## 2013-06-15 NOTE — Telephone Encounter (Signed)
ok 

## 2013-06-17 ENCOUNTER — Other Ambulatory Visit (INDEPENDENT_AMBULATORY_CARE_PROVIDER_SITE_OTHER): Payer: Managed Care, Other (non HMO)

## 2013-06-17 ENCOUNTER — Ambulatory Visit (INDEPENDENT_AMBULATORY_CARE_PROVIDER_SITE_OTHER): Payer: Managed Care, Other (non HMO) | Admitting: Physician Assistant

## 2013-06-17 ENCOUNTER — Encounter: Payer: Self-pay | Admitting: Physician Assistant

## 2013-06-17 VITALS — BP 102/72 | HR 80 | Ht 70.0 in | Wt 184.0 lb

## 2013-06-17 DIAGNOSIS — K625 Hemorrhage of anus and rectum: Secondary | ICD-10-CM

## 2013-06-17 DIAGNOSIS — K644 Residual hemorrhoidal skin tags: Secondary | ICD-10-CM

## 2013-06-17 DIAGNOSIS — K589 Irritable bowel syndrome without diarrhea: Secondary | ICD-10-CM

## 2013-06-17 LAB — CBC WITH DIFFERENTIAL/PLATELET
BASOS PCT: 0.3 % (ref 0.0–3.0)
Basophils Absolute: 0 10*3/uL (ref 0.0–0.1)
EOS ABS: 0.1 10*3/uL (ref 0.0–0.7)
Eosinophils Relative: 1.2 % (ref 0.0–5.0)
HCT: 44.2 % (ref 39.0–52.0)
Hemoglobin: 14.5 g/dL (ref 13.0–17.0)
Lymphocytes Relative: 28.7 % (ref 12.0–46.0)
Lymphs Abs: 2.6 10*3/uL (ref 0.7–4.0)
MCHC: 32.8 g/dL (ref 30.0–36.0)
MCV: 96.1 fl (ref 78.0–100.0)
MONO ABS: 0.6 10*3/uL (ref 0.1–1.0)
Monocytes Relative: 6.9 % (ref 3.0–12.0)
NEUTROS PCT: 62.9 % (ref 43.0–77.0)
Neutro Abs: 5.6 10*3/uL (ref 1.4–7.7)
Platelets: 224 10*3/uL (ref 150.0–400.0)
RBC: 4.59 Mil/uL (ref 4.22–5.81)
RDW: 14 % (ref 11.5–15.5)
WBC: 8.9 10*3/uL (ref 4.0–10.5)

## 2013-06-17 MED ORDER — DICYCLOMINE HCL 10 MG PO CAPS: ORAL_CAPSULE | ORAL | Status: AC

## 2013-06-17 NOTE — Progress Notes (Signed)
Subjective:    Patient ID: Phillip Bachelor., male    DOB: May 17, 1990, 23 y.o.   MRN: [id number]  HPI  Phillip Reese is a 23 year old white male known to Dr. Deatra Ina. He has diagnosis of GERD, IBS diarrhea predominant and has had difficulty with hemorrhoids. He also has history of colon polyps. He had undergone colonoscopy in October of 2014 and was found to have 2 sessile polyps both of which were removed , random biopsies were done to rule out microscopic colitis and these were negative. Path from the polyps consistent with a tubular adenoma and a hamartomatous polyp. Noted to have small internal hemorrhoids. Patient comes in today stating that he still having problems with diarrhea which is usually 3 bowel movements per day and some urgency postprandially. Stools always loose. He has no current complaints of abdominal pain. Random biopsies again were negative and celiac testing have been done and negative as well.  Patient says he had been having problems with hemorrhoidal pain ,externally for about a week and then last weekend did some very heavy lifting moving a bridge for his grandfather on their property and that evening noted a large amount of blood in his underwear. He says the interesting thing was that his rectal pain had stopped. After that he noted intermittent rectal bleeding over couple of days with bright red blood on the tissue. He has not seen any more blood over the past few days and has minimal rectal discomfort. He is frustrated by hemorrhoidal symptoms     Review of Systems  Constitutional: Negative.   HENT: Negative.   Eyes: Negative.   Respiratory: Negative.   Cardiovascular: Negative.   Gastrointestinal: Positive for diarrhea, anal bleeding and rectal pain.  Endocrine: Negative.   Genitourinary: Negative.   Musculoskeletal: Negative.   Skin: Negative.   Allergic/Immunologic: Negative.   Neurological: Negative.   Hematological: Negative.   Psychiatric/Behavioral: The  patient is nervous/anxious.    Outpatient Prescriptions Prior to Visit  Medication Sig Dispense Refill  . amitriptyline (ELAVIL) 25 MG tablet Take 25 mg by mouth at bedtime. Take 1 tab daily at bedtime.      . hydrocortisone (ANUSOL-HC) 25 MG suppository Place 1 suppository (25 mg total) rectally 2 (two) times daily.  12 suppository  0  . lidocaine (XYLOCAINE) 5 % ointment Apply to anal area 3 times daily as needed  35.44 g  0  . lithium carbonate 300 MG capsule Take 300 mg by mouth 3 (three) times daily with meals.      Marland Kitchen omeprazole-sodium bicarbonate (ZEGERID) 40-1100 MG per capsule Take 1 capsule by mouth daily before supper.  30 capsule  3  . prazosin (MINIPRESS) 1 MG capsule Take 1 mg by mouth at bedtime.      Marland Kitchen QUEtiapine (SEROQUEL) 100 MG tablet Take 350 mg by mouth at bedtime. Take 3 1/2 tab at bedtime.       No facility-administered medications prior to visit.   Allergies  Allergen Reactions  . Contrast Media [Iodinated Diagnostic Agents] Other (See Comments)    Ears felt like they were on fire and felt like someone was choking him.   Patient Active Problem List   Diagnosis Date Noted  . Psychiatric disorder 04/23/2013  . 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 Hemorrhage of rectum and anus 11/23/2012  . Internal hemorrhoids with other complication 98/92/1194  . Esophageal reflux 11/23/2012  . Tobacco use disorder 11/06/2012  . IBS (irritable bowel syndrome)    History  Substance Use Topics  . Smoking status: Current Every Day Smoker -- 1.50 packs/day    Types: Cigarettes  . Smokeless tobacco: Former Systems developer     Comment: dipped in high school  . Alcohol Use: No     Comment: once or twice a month   family history includes Diabetes in his paternal grandfather; Lupus in his mother; Stomach cancer in an other family member. There is no history of Colon cancer.     Objective:   Physical Exam  white male in no acute distress, pleasant blood pressure 102/72 pulse 80 height 5  foot 10 weight 184. HEENT; nontraumatic normal cephalic EOMI PERRLA sclera anicteric, Cardiovascular ;regular rate and rhythm with S1-S2 no murmur rub or gallop, Ulnar clear bilaterally, Abdomen; soft nontender nondistended bowel sounds active, Rectal; exam he has a partially thrombosed external hemorrhoid there is a small amount of blood oozing -hemorrhoid is soft and minimally tender, Extremities; no clubbing, cyanosis, or edema exam of his back shows scattered acne and scarring, Psych; mood and affect appropriate        Assessment & Plan:  #23 year old white male with IBS diarrhea predominant #2 GERD #3 unspecified psychiatric disorder #4 partially thrombosed external hemorrhoid resolving with rectal bleeding secondary  #5 colon polyps one adenomatous and one hamartomatous polyp at colonoscopy October 2014  Plan; Sitz baths over the next 4-5 days Samples were given of Analex -steroid/lidocaine combination to use 3-4 times daily as needed over the next week We'll give him a trial of Bentyl 10 mg twice daily for IBS symptoms Followup Dr. Deatra Ina as needed

## 2013-06-17 NOTE — Patient Instructions (Addendum)
You have been given a separate informational sheet regarding your tobacco use, the importance of quitting and local resources to help you quit. We have sent a prescription for Bentyl ( dicyclomine) [place] , [place].   We have given you samples of Ana lex cream for hemorrhoids.  Use samples 2-3 times a day .  Do warm/hot tub soaks several times a day.

## 2013-06-17 NOTE — Progress Notes (Signed)
Reviewed and agree with management. Donal Lynam D. Pecola Haxton, M.D., FACG  

## 2015-04-07 IMAGING — CT CT ABD-PELV W/O CM
2 of 4 series · 17 of 46 positions shown, 19 images · non-contrast
Comparison: DG ABDOMEN ACUTE 2V W/ 1V CHEST dated 06/18/2011; US
GALLBLADDER-BILIARY (RUQ) dated 02/04/2011

CLINICAL DATA: Evaluate chronic diarrhea and rectal bleeding
possible year double bowel syndrome, oral contrast only due to IV
contrast allergy

EXAM:
CT ABDOMEN AND PELVIS WITHOUT CONTRAST
TECHNIQUE: Multidetector CT imaging of the abdomen and pelvis was performed
following the standard protocol without intravenous contrast.

[Series 2: ap without · axial · non-contrast · 0.68mm/px · z∈[-523,-83]mm · 14 of 96 slices shown, 16 images]
[im 4/96  soft-tissue]
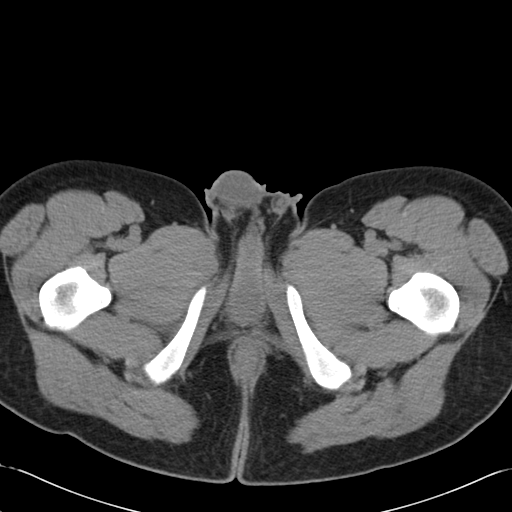
[im 4/96  bone]
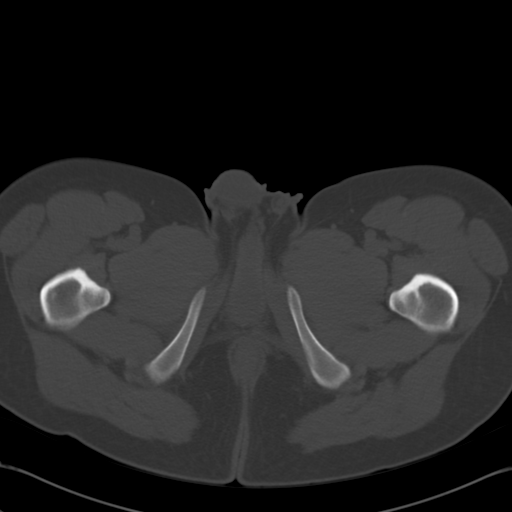
[im 12/96  soft-tissue]
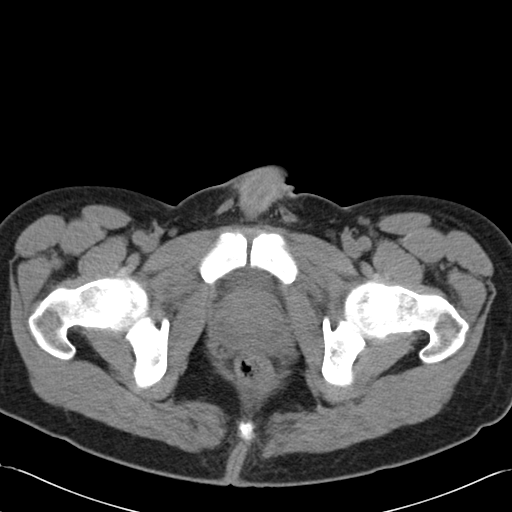
[im 20/96  soft-tissue]
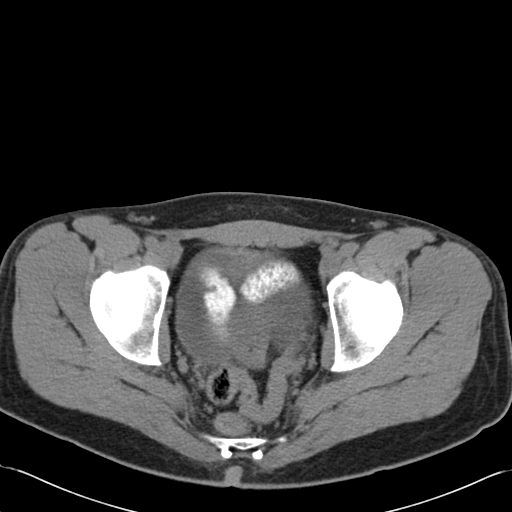
[im 27/96  soft-tissue]
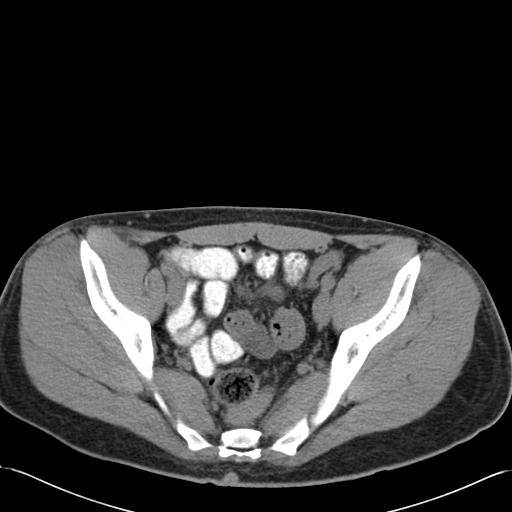
[im 31/96  soft-tissue]
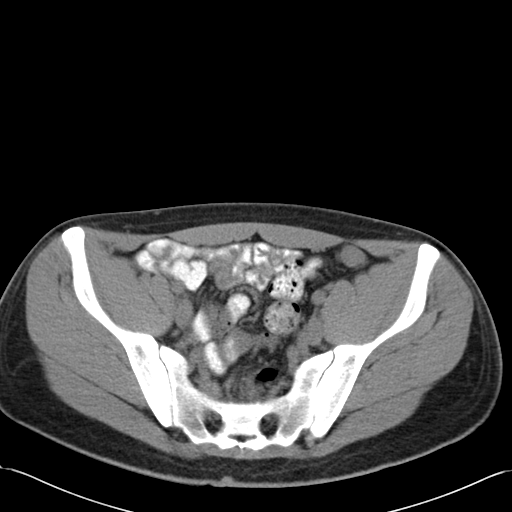
[im 39/96  soft-tissue]
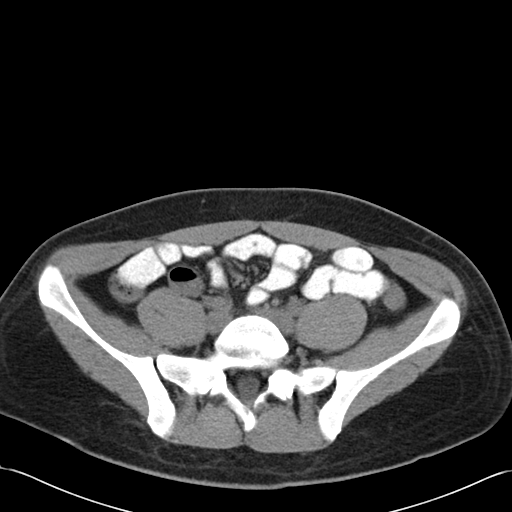
[im 46/96  soft-tissue]
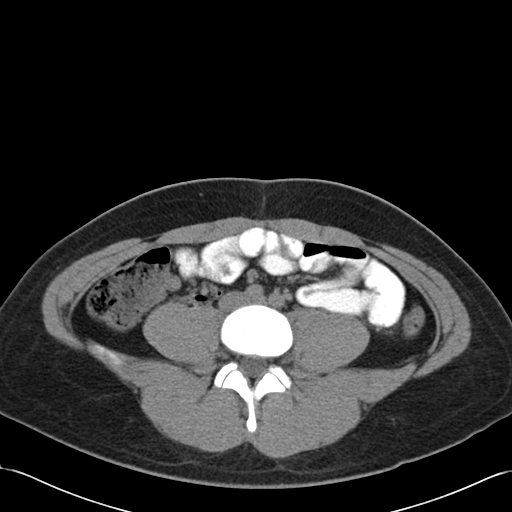
[im 50/96  soft-tissue]
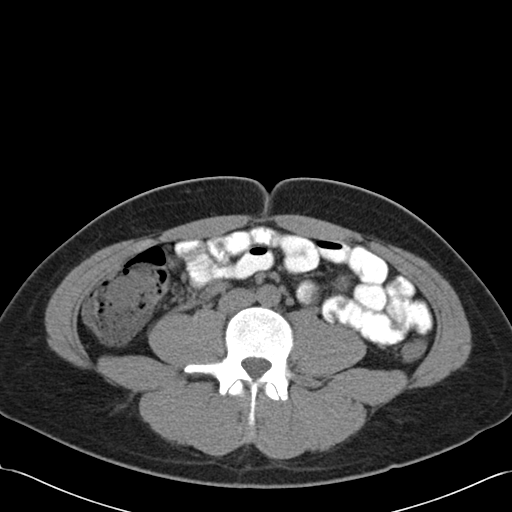
[im 58/96  soft-tissue]
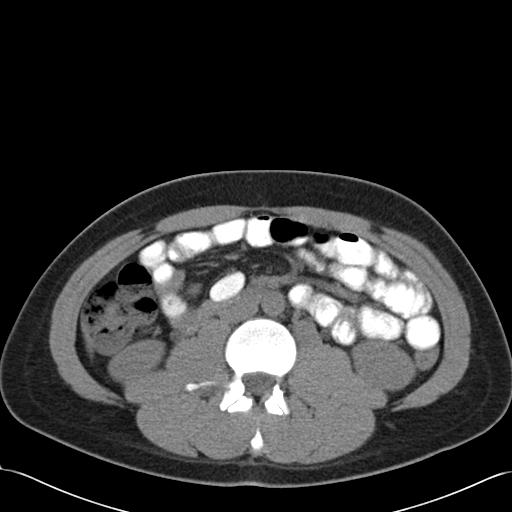
[im 58/96  bone]
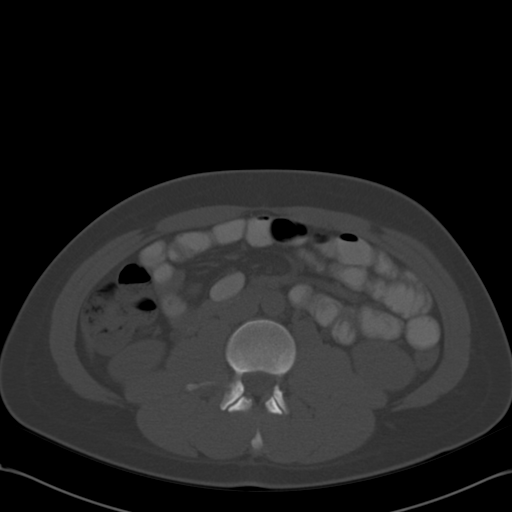
[im 65/96  soft-tissue]
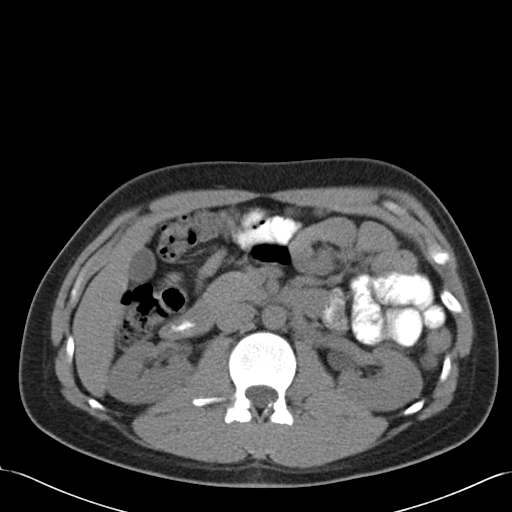
[im 73/96  soft-tissue]
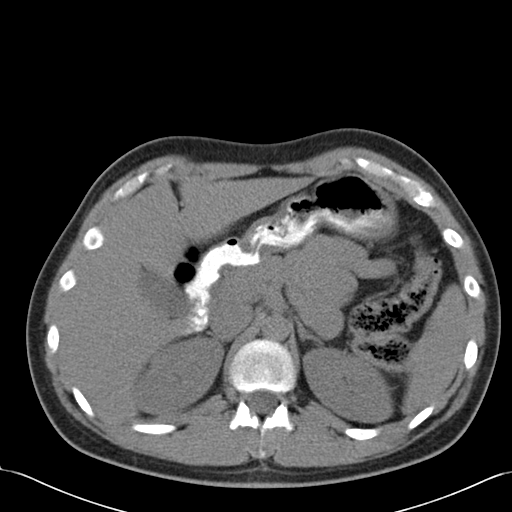
[im 77/96  soft-tissue]
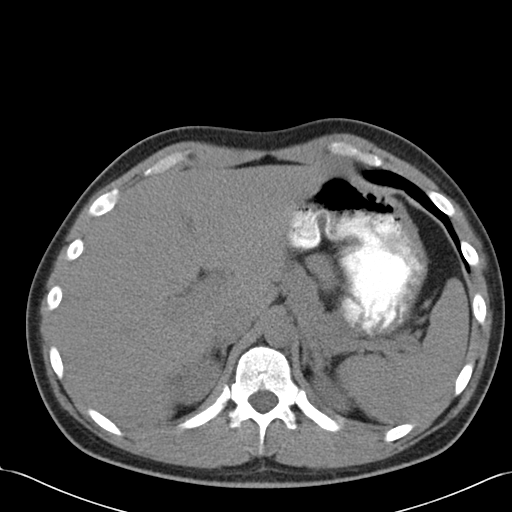
[im 84/96  soft-tissue]
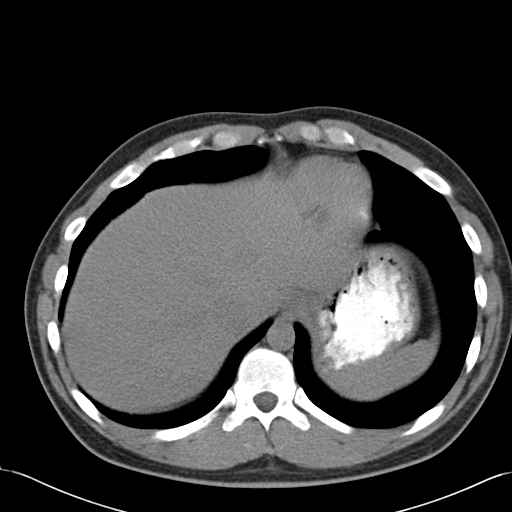
[im 92/96  soft-tissue]
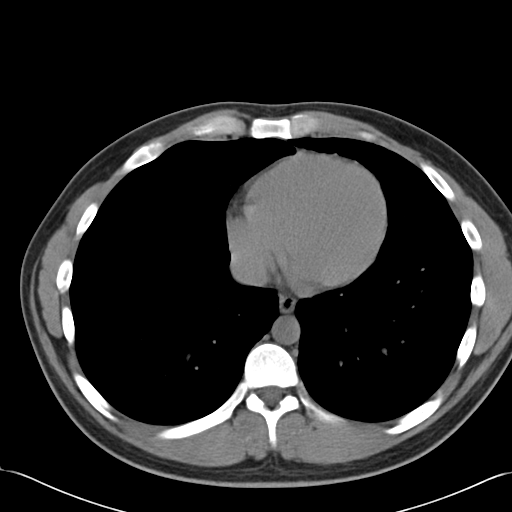

[Series 602: cor · coronal · 0.97mm/px · 3 of 130 slices shown]
[im 44/130  soft-tissue]
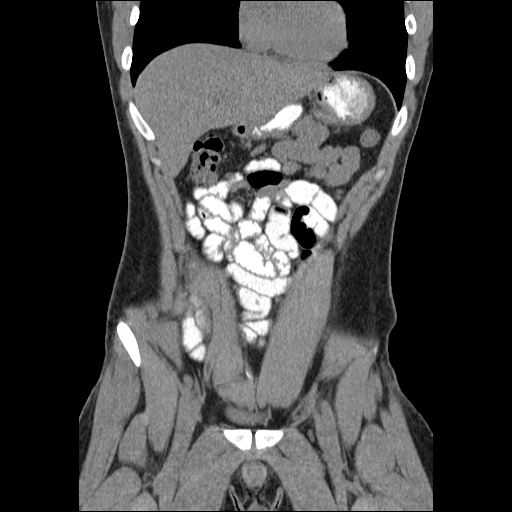
[im 58/130  soft-tissue]
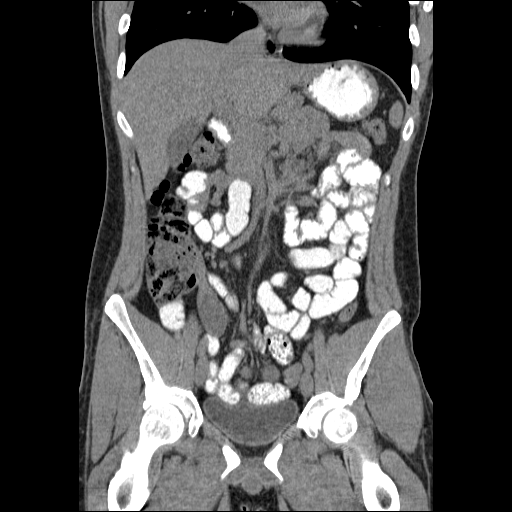
[im 72/130  soft-tissue]
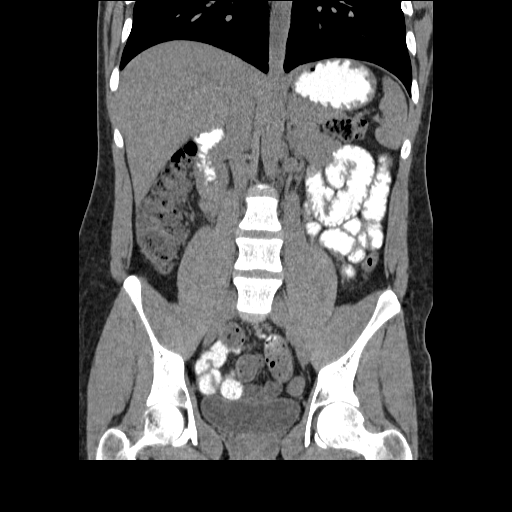

[17 of 46 positions shown; findings below may reference images not displayed]

FINDINGS: Visualized lung bases clear.  No acute musculoskeletal findings.

Given limited evaluation without contrast, liver, gallbladder,
spleen, pancreas, kidneys, and adrenal glands are normal. Appendix
is normal. It aorta appears normal. Bladder and reproductive organs
are normal. No significant adenopathy. No free fluid.

There is oral contrast throughout the small bowel, which appears
normal. Oral contrast has not reached the colon. Colon is grossly
normal given limited evaluation.
IMPRESSION: Limited evaluation of the bowel without IV contrast and with no
contrast within the colon. Allowing for this, no abnormalities
appreciated. Left colon shows an appearance suggesting mild wall
thickening but this is likely created by the fact that the left
colon is decompressed. Consider barium enema to better evaluate the
colon.
